# Patient Record
Sex: Male | Born: 1990 | Race: Black or African American | Hispanic: No | Marital: Single | Smoking: Never smoker
Health system: Southern US, Community
[De-identification: ages and names within clinical notes are randomized; demographics above are authoritative.]

## PROBLEM LIST (undated history)

## (undated) DIAGNOSIS — T7840XA Allergy, unspecified, initial encounter: Secondary | ICD-10-CM

## (undated) HISTORY — DX: Allergy, unspecified, initial encounter: T78.40XA

---

## 2015-06-05 ENCOUNTER — Ambulatory Visit (INDEPENDENT_AMBULATORY_CARE_PROVIDER_SITE_OTHER): Payer: BLUE CROSS/BLUE SHIELD | Admitting: Physician Assistant

## 2015-06-05 VITALS — BP 110/76 | HR 78 | Temp 98.3°F | Resp 16 | Ht 75.5 in | Wt 203.0 lb

## 2015-06-05 DIAGNOSIS — B354 Tinea corporis: Secondary | ICD-10-CM | POA: Diagnosis not present

## 2015-06-05 MED ORDER — KETOCONAZOLE 2 % EX CREA: 1.0000 "application " | TOPICAL_CREAM | Freq: Every day | CUTANEOUS | Status: AC

## 2015-06-05 NOTE — Progress Notes (Signed)
[facility] [address] [place] [address] [phone]  Date:  06/05/2015   Name:  Luis Ballard   DOB:  04-24-1991   MRN:  [id number]  PCP:  No PCP Per Patient    Chief Complaint: rash   History of Present Illness:  This is a 24 y.o. male who is presenting with a rash on the back on his neck. His mother noticed the rash today. He states he noticed a dry spot on the back on his neck 1 week ago but didn't think much of it. The area is not pruritic or painful. No lesions anywhere else on his body. His scalp is red but he states that is because he uses a stiff brush and brushes his head very hard. He states he has always had a flaky scalp and face. Generally doesn't consider himself to have sensitive skin. Did not have eczema as a child. He denies any PMH.   Review of Systems:  Review of Systems See HPI  There are no active problems to display for this patient.   Prior to Admission medications   Not on File    No Known Allergies  History reviewed. No pertinent past surgical history.  History  Substance Use Topics  . Smoking status: Never Smoker   . Smokeless tobacco: Not on file  . Alcohol Use: No    History reviewed. No pertinent family history.  Medication list has been reviewed and updated.  Physical Examination:  Physical Exam  Constitutional: He is oriented to person, place, and time. He appears well-developed and well-nourished. No distress.  HENT:  Head: Normocephalic and atraumatic.  Right Ear: Hearing normal.  Left Ear: Hearing normal.  Nose: Nose normal.  Eyes: Conjunctivae and lids are normal. Right eye exhibits no discharge. Left eye exhibits no discharge. No scleral icterus.  Pulmonary/Chest: Effort normal. No respiratory distress.  Musculoskeletal: Normal range of motion.  Neurological: He is alert and oriented to person, place, and time.  Skin: Skin is warm, dry and intact.  Left posterior neck with erythematous and  flaking annular lesion with central clearing. Scalp generally erythematous with some mild flaking but no distinct lesion. Mild flaking in bilateral eyebrows.   Psychiatric: He has a normal mood and affect. His speech is normal and behavior is normal. Thought content normal.   BP 110/76 mmHg  Pulse 78  Temp(Src) 98.3 F (36.8 C) (Oral)  Resp 16  Ht 6' 3.5" (1.918 m)  Wt 203 lb (92.08 kg)  BMI 25.03 kg/m2  SpO2 98%  Assessment and Plan:  1. Tinea corporis Lesion on back of neck characteristic of tinea corporis. He will apply ketoconazole QD x 2 weeks. Counseled on washing clothing, sheets, towels. Stay dry. Return if not getting better in 2 weeks. Scalp erythema and flaking likely d/t seborrhea. Discussed skin/scalp care. - ketoconazole (NIZORAL) 2 % cream; Apply 1 application topically daily.  Dispense: 15 g; Refill: 0   Alfonso Carden V. Dyke Brackett, MHS [facility]  06/07/2015

## 2015-06-05 NOTE — Patient Instructions (Signed)
Apply cream once a day for two weeks. Try to stay as dry as possible. Clean your towels and sheets. Return if not getting better after 2 weeks.  Body Ringworm Ringworm (tinea corporis) is a fungal infection of the skin on the body. This infection is not caused by worms, but is actually caused by a fungus. Fungus normally lives on the top of your skin and can be useful. However, in the case of ringworms, the fungus grows out of control and causes a skin infection. It can involve any area of skin on the body and can spread easily from one person to another (contagious). Ringworm is a common problem for children, but it can affect adults as well. Ringworm is also often found in athletes, especially wrestlers who share equipment and mats.  CAUSES  Ringworm of the body is caused by a fungus called dermatophyte. It can spread by:  Touchingother people who are infected.  Touchinginfected pets.  Touching or sharingobjects that have been in contact with the infected person or pet (hats, combs, towels, clothing, sports equipment). SYMPTOMS   Itchy, raised red spots and bumps on the skin.  Ring-shaped rash.  Redness near the border of the rash with a clear center.  Dry and scaly skin on or around the rash. Not every person develops a ring-shaped rash. Some develop only the red, scaly patches. DIAGNOSIS  Most often, ringworm can be diagnosed by performing a skin exam. Your caregiver may choose to take a skin scraping from the affected area. The sample will be examined under the microscope to see if the fungus is present.  TREATMENT  Body ringworm may be treated with a topical antifungal cream or ointment. Sometimes, an antifungal shampoo that can be used on your body is prescribed. You may be prescribed antifungal medicines to take by mouth if your ringworm is severe, keeps coming back, or lasts a long time.  HOME CARE INSTRUCTIONS   Only take over-the-counter or prescription medicines as  directed by your caregiver.  Wash the infected area and dry it completely before applying yourcream or ointment.  When using antifungal shampoo to treat the ringworm, leave the shampoo on the body for 3-5 minutes before rinsing.   Wear loose clothing to stop clothes from rubbing and irritating the rash.  Wash or change your bed sheets every night while you have the rash.  Have your pet treated by your veterinarian if it has the same infection. To prevent ringworm:   Practice good hygiene.  Wear sandals or shoes in public places and showers.  Do not share personal items with others.  Avoid touching red patches of skin on other people.  Avoid touching pets that have bald spots or wash your hands after doing so. SEEK MEDICAL CARE IF:   Your rash continues to spread after 7 days of treatment.  Your rash is not gone in 4 weeks.  The area around your rash becomes red, warm, tender, and swollen. Document Released: 10/11/2000 Document Revised: 07/08/2012 Document Reviewed: 04/27/2012 [facility] Patient Information 2015 [place], [place]. This information is not intended to replace advice given to you by your health care provider. Make sure you discuss any questions you have with your health care provider.

## 2015-09-15 ENCOUNTER — Ambulatory Visit (INDEPENDENT_AMBULATORY_CARE_PROVIDER_SITE_OTHER): Payer: BLUE CROSS/BLUE SHIELD

## 2015-09-16 ENCOUNTER — Ambulatory Visit (INDEPENDENT_AMBULATORY_CARE_PROVIDER_SITE_OTHER): Payer: BLUE CROSS/BLUE SHIELD | Admitting: Family Medicine

## 2015-09-16 VITALS — BP 116/82 | HR 86 | Temp 99.0°F | Resp 16 | Ht 75.5 in | Wt 206.2 lb

## 2015-09-16 DIAGNOSIS — R05 Cough: Secondary | ICD-10-CM

## 2015-09-16 DIAGNOSIS — J069 Acute upper respiratory infection, unspecified: Secondary | ICD-10-CM

## 2015-09-16 DIAGNOSIS — J029 Acute pharyngitis, unspecified: Secondary | ICD-10-CM | POA: Diagnosis not present

## 2015-09-16 DIAGNOSIS — R059 Cough, unspecified: Secondary | ICD-10-CM

## 2015-09-16 LAB — POCT RAPID STREP A (OFFICE): RAPID STREP A SCREEN: NEGATIVE

## 2015-09-16 MED ORDER — AMOXICILLIN-POT CLAVULANATE 875-125 MG PO TABS
1.0000 | ORAL_TABLET | Freq: Two times a day (BID) | ORAL | Status: DC
Start: 2015-09-16 — End: 2016-02-06

## 2015-09-16 MED ORDER — HYDROCODONE-HOMATROPINE 5-1.5 MG/5ML PO SYRP: ORAL_SOLUTION | ORAL | Status: DC

## 2015-09-16 MED ORDER — BENZONATATE 100 MG PO CAPS: 100.0000 mg | ORAL_CAPSULE | Freq: Three times a day (TID) | ORAL | Status: DC | PRN

## 2015-09-16 NOTE — Progress Notes (Signed)
Subjective:    Patient ID: Luis Ballard, male    DOB: 04-03-1991, 24 y.o.   MRN: [id number]  HPI Luis Ballard is a 24 y.o. male  Started over 2 weeks ago with sinus congestion, sore throat, then cough and congestion. Tried otc zyrtec, Nyquil, cold eeze cough drops.  Started to feel a little better in a few days, then feeling worse  - went to Urgent care 2 weeks ago. Treated with amoxicillin for "sinus infection", negative strep test. He only took amoxicillin for 6 days initially as was feeling better. Then 5 days ago - more sore throat, then body aches, stuffy nose. No sinus pressure or pain. Dry cough - feels like more productive past 2 days - green color. More sore throat, and cough throughout the night. Slight stuffy nose, but cough and sore throat primary issues. Took leftover amoxicillin past 4 days.   No fever, no wheezing or shortness of breath. Occasional sweats at night, but no fever. Ricola cough drops, no other otc meds past few days as taking abx's.   Uses qtips. Has used debrox in past with good relief.   There are no active problems to display for this patient.  Past Medical History  Diagnosis Date  . Allergy    History reviewed. No pertinent past surgical history. Allergies  Allergen Reactions  . Sulfa Antibiotics Other (See Comments)    As a child...does not know reaction   Prior to Admission medications   Not on File   Social History   Social History  . Marital Status: Single    Spouse Name: N/A  . Number of Children: N/A  . Years of Education: N/A   Occupational History  . Not on file.   Social History Main Topics  . Smoking status: Never Smoker   . Smokeless tobacco: Never Used  . Alcohol Use: 0.0 oz/week    0 Standard drinks or equivalent per week  . Drug Use: No  . Sexual Activity: Not on file   Other Topics Concern  . Not on file   Social History Narrative       Review of Systems  Constitutional: Positive for diaphoresis. Negative  for fever and chills.  HENT: Positive for congestion, rhinorrhea and sore throat. Negative for ear discharge, ear pain, facial swelling, hearing loss, sinus pressure and trouble swallowing.   Respiratory: Positive for cough. Negative for chest tightness and shortness of breath.        Objective:   Physical Exam  Constitutional: He is oriented to person, place, and time. He appears well-developed and well-nourished.  HENT:  Head: Normocephalic and atraumatic.  Right Ear: Tympanic membrane, external ear and ear canal normal.  Left Ear: Tympanic membrane, external ear and ear canal normal.  Nose: No rhinorrhea.  Mouth/Throat: Mucous membranes are normal. Posterior oropharyngeal erythema (minimal) present. No oropharyngeal exudate or tonsillar abscesses.  Nonobstructive cerumen in bilateral canals. Able to hear out of both ears.  Eyes: Conjunctivae are normal. Pupils are equal, round, and reactive to light.  Neck: Neck supple.  Cardiovascular: Normal rate, regular rhythm, normal heart sounds and intact distal pulses.   No murmur heard. Pulmonary/Chest: Effort normal and breath sounds normal. He has no wheezes. He has no rhonchi. He has no rales.  Abdominal: Soft. There is no tenderness.  Lymphadenopathy:    He has no cervical adenopathy.  Neurological: He is alert and oriented to person, place, and time.  Skin: Skin is warm and dry. No rash noted.  Psychiatric: He has a normal mood and affect. His behavior is normal.  Vitals reviewed.  Filed Vitals:   09/16/15 1144  BP: 116/82  Pulse: 86  Temp: 99 F (37.2 C)  TempSrc: Oral  Resp: 16  Height: 6' 3.5" (1.918 m)  Weight: 206 lb 4 oz (93.554 kg)  SpO2: 98%   Results for orders placed or performed in visit on 09/16/15  POCT rapid strep A  Result Value Ref Range   Rapid Strep A Screen Negative Negative      Assessment & Plan:   Luis Ballard is a 24 y.o. male Sore throat - Plan: POCT rapid strep A, amoxicillin-clavulanate  (AUGMENTIN) 875-125 MG tablet  Acute upper respiratory infection  Cough - Plan: benzonatate (TESSALON) 100 MG capsule, HYDROcodone-homatropine (HYCODAN) 5-1.5 MG/5ML syrup    suspected viral infection, and based on his initial description of symptoms 2 weeks ago, possible viral infection at that time as well. This may be persistent symptoms from the initial infection or a secondary viral illness. Less likely strep throat with no lymphadenopathy, fever, or exudate on tonsils, but  complicated by his partial use of antibiotics prior to testing today.  Also no sinus tenderness or symptoms of a sinus infection at this time.  -mucinex or Tessalon Perles as needed for cough, Hycodan cough syrup as needed at nighttime. Cepacol or other cough drops and sore throat care was discussed.  - If he is not improving the next few days, can start Augmentin, but discussed risks of subsequent use of multiple antibiotics. Correct antibiotic use discussed in the future.   -RTC/ER precautions given.   Advised against qtip use in ears. No impaction at present - can try debrox otc, and rtc for lavage if needed for impaction sx's.    Meds ordered this encounter  Medications  . benzonatate (TESSALON) 100 MG capsule    Sig: Take 1 capsule (100 mg total) by mouth 3 (three) times daily as needed for cough.    Dispense:  20 capsule    Refill:  0  . HYDROcodone-homatropine (HYCODAN) 5-1.5 MG/5ML syrup    Sig: 19m by mouth a bedtime as needed for cough.    Dispense:  120 mL    Refill:  0  . amoxicillin-clavulanate (AUGMENTIN) 875-125 MG tablet    Sig: Take 1 tablet by mouth 2 (two) times daily.    Dispense:  20 tablet    Refill:  0   Patient Instructions   Your exam today still appears to be consistent with a virus or viral infection.   Try over-the-counter Cepacol cough drops with a sore throat, Chloraseptic spray is okay to use as well.  Tylenol or Motrin as needed for body aches and sore throat. For your cough, try  over-the-counter Mucinex, or the Tessalon Perles that I prescribed during the day up to 3 times per day. If the cough is keeping you up at night, I did also write for some hydrocodone cough syrup. This can make you very sleepy so be careful operating machinery or driving  In the morning if you have used this medication the night before.   your strep test was negative, but this may be complicated given your recent antibiotic use.  Usually strep throat has other symptoms than what I see here today, but if you are not improving in the next few days,   I did write for Augmentin, which is a stronger antibiotic in which you report prescribed previously. However as we discussed, there  are side effects none only with this antibiotic but risks of other bacterial infections or reactions with multiple antibiotic uses. If you are improving, do not take this antibiotic and  Throw away the prescription  Return to the clinic or go to the nearest emergency room if any of your symptoms worsen or new symptoms occur.   Sore Throat A sore throat is pain, burning, irritation, or scratchiness of the throat. There is often pain or tenderness when swallowing or talking. A sore throat may be accompanied by other symptoms, such as coughing, sneezing, fever, and swollen neck glands. A sore throat is often the first sign of another sickness, such as a cold, flu, strep throat, or mononucleosis (commonly known as mono). Most sore throats go away without medical treatment. CAUSES  The most common causes of a sore throat include:  A viral infection, such as a cold, flu, or mono.  A bacterial infection, such as strep throat, tonsillitis, or whooping cough.  Seasonal allergies.  Dryness in the air.  Irritants, such as smoke or pollution.  Gastroesophageal reflux disease (GERD). HOME CARE INSTRUCTIONS   Only take over-the-counter medicines as directed by your caregiver.  Drink enough fluids to keep your urine clear or pale  yellow.  Rest as needed.  Try using throat sprays, lozenges, or sucking on hard candy to ease any pain (if older than 4 years or as directed).  Sip warm liquids, such as broth, herbal tea, or warm water with honey to relieve pain temporarily. You may also eat or drink cold or frozen liquids such as frozen ice pops.  Gargle with salt water (mix 1 tsp salt with 8 oz of water).  Do not smoke and avoid secondhand smoke.  Put a cool-mist humidifier in your bedroom at night to moisten the air. You can also turn on a hot shower and sit in the bathroom with the door closed for 5-10 minutes. SEEK IMMEDIATE MEDICAL CARE IF:  You have difficulty breathing.  You are unable to swallow fluids, soft foods, or your saliva.  You have increased swelling in the throat.  Your sore throat does not get better in 7 days.  You have nausea and vomiting.  You have a fever or persistent symptoms for more than 2-3 days.  You have a fever and your symptoms suddenly get worse. MAKE SURE YOU:   Understand these instructions.  Will watch your condition.  Will get help right away if you are not doing well or get worse.   This information is not intended to replace advice given to you by your health care provider. Make sure you discuss any questions you have with your health care provider.   Document Released: 11/21/2004 Document Revised: 11/04/2014 Document Reviewed: 06/21/2012 Elsevier Interactive Patient Education 2016 [employer] Inc[employer]  Upper Respiratory Infection, Adult Most upper respiratory infections (URIs) are a viral infection of the air passages leading to the lungs. A URI affects the nose, throat, and upper air passages. The most common type of URI is nasopharyngitis and is typically referred to as "the common cold." URIs run their course and usually go away on their own. Most of the time, a URI does not require medical attention, but sometimes a bacterial infection in the upper airways can follow  a viral infection. This is called a secondary infection. Sinus and middle ear infections are common types of secondary upper respiratory infections. Bacterial pneumonia can also complicate a URI. A URI can worsen asthma and chronic obstructive pulmonary disease (  COPD). Sometimes, these complications can require emergency medical care and may be life threatening.  CAUSES Almost all URIs are caused by viruses. A virus is a type of germ and can spread from one person to another.  RISKS FACTORS You may be at risk for a URI if:   You smoke.   You have chronic heart or lung disease.  You have a weakened defense (immune) system.   You are very young or very old.   You have nasal allergies or asthma.  You work in crowded or poorly ventilated areas.  You work in health care facilities or schools. SIGNS AND SYMPTOMS  Symptoms typically develop 2-3 days after you come in contact with a cold virus. Most viral URIs last 7-10 days. However, viral URIs from the influenza virus (flu virus) can last 14-18 days and are typically more severe. Symptoms may include:   Runny or stuffy (congested) nose.   Sneezing.   Cough.   Sore throat.   Headache.   Fatigue.   Fever.   Loss of appetite.   Pain in your forehead, behind your eyes, and over your cheekbones (sinus pain).  Muscle aches.  DIAGNOSIS  Your health care provider may diagnose a URI by:  Physical exam.  Tests to check that your symptoms are not due to another condition such as:  Strep throat.  Sinusitis.  Pneumonia.  Asthma. TREATMENT  A URI goes away on its own with time. It cannot be cured with medicines, but medicines may be prescribed or recommended to relieve symptoms. Medicines may help:  Reduce your fever.  Reduce your cough.  Relieve nasal congestion. HOME CARE INSTRUCTIONS   Take medicines only as directed by your health care provider.   Gargle warm saltwater or take cough drops to comfort  your throat as directed by your health care provider.  Use a warm mist humidifier or inhale steam from a shower to increase air moisture. This may make it easier to breathe.  Drink enough fluid to keep your urine clear or pale yellow.   Eat soups and other clear broths and maintain good nutrition.   Rest as needed.   Return to work when your temperature has returned to normal or as your health care provider advises. You may need to stay home longer to avoid infecting others. You can also use a face mask and careful hand washing to prevent spread of the virus.  Increase the usage of your inhaler if you have asthma.   Do not use any tobacco products, including cigarettes, chewing tobacco, or electronic cigarettes. If you need help quitting, ask your health care provider. PREVENTION  The best way to protect yourself from getting a cold is to practice good hygiene.   Avoid oral or hand contact with people with cold symptoms.   Wash your hands often if contact occurs.  There is no clear evidence that vitamin C, vitamin E, echinacea, or exercise reduces the chance of developing a cold. However, it is always recommended to get plenty of rest, exercise, and practice good nutrition.  SEEK MEDICAL CARE IF:   You are getting worse rather than better.   Your symptoms are not controlled by medicine.   You have chills.  You have worsening shortness of breath.  You have brown or red mucus.  You have yellow or brown nasal discharge.  You have pain in your face, especially when you bend forward.  You have a fever.  You have swollen neck glands.  You have pain while swallowing.  You have white areas in the back of your throat. SEEK IMMEDIATE MEDICAL CARE IF:   You have severe or persistent:  Headache.  Ear pain.  Sinus pain.  Chest pain.  You have chronic lung disease and any of the following:  Wheezing.  Prolonged cough.  Coughing up blood.  A change in your  usual mucus.  You have a stiff neck.  You have changes in your:  Vision.  Hearing.  Thinking.  Mood. MAKE SURE YOU:   Understand these instructions.  Will watch your condition.  Will get help right away if you are not doing well or get worse.   This information is not intended to replace advice given to you by your health care provider. Make sure you discuss any questions you have with your health care provider.   Document Released: 04/09/2001 Document Revised: 02/28/2015 Document Reviewed: 01/19/2014 Elsevier Interactive Patient Education 2016 Elsevier Inc.  Cough, Adult Coughing is a reflex that clears your throat and your airways. Coughing helps to heal and protect your lungs. It is normal to cough occasionally, but a cough that happens with other symptoms or lasts a long time may be a sign of a condition that needs treatment. A cough may last only 2-3 weeks (acute), or it may last longer than 8 weeks (chronic). CAUSES Coughing is commonly caused by:  Breathing in substances that irritate your lungs.  A viral or bacterial respiratory infection.  Allergies.  Asthma.  Postnasal drip.  Smoking.  Acid backing up from the stomach into the esophagus (gastroesophageal reflux).  Certain medicines.  Chronic lung problems, including COPD (or rarely, lung cancer).  Other medical conditions such as heart failure. HOME CARE INSTRUCTIONS  Pay attention to any changes in your symptoms. Take these actions to help with your discomfort:  Take medicines only as told by your health care provider.  If you were prescribed an antibiotic medicine, take it as told by your health care provider. Do not stop taking the antibiotic even if you start to feel better.  Talk with your health care provider before you take a cough suppressant medicine.  Drink enough fluid to keep your urine clear or pale yellow.  If the air is dry, use a cold steam vaporizer or humidifier in your bedroom  or your home to help loosen secretions.  Avoid anything that causes you to cough at work or at home.  If your cough is worse at night, try sleeping in a semi-upright position.  Avoid cigarette smoke. If you smoke, quit smoking. If you need help quitting, ask your health care provider.  Avoid caffeine.  Avoid alcohol.  Rest as needed. SEEK MEDICAL CARE IF:   You have new symptoms.  You cough up pus.  Your cough does not get better after 2-3 weeks, or your cough gets worse.  You cannot control your cough with suppressant medicines and you are losing sleep.  You develop pain that is getting worse or pain that is not controlled with pain medicines.  You have a fever.  You have unexplained weight loss.  You have night sweats. SEEK IMMEDIATE MEDICAL CARE IF:  You cough up blood.  You have difficulty breathing.  Your heartbeat is very fast.   This information is not intended to replace advice given to you by your health care provider. Make sure you discuss any questions you have with your health care provider.   Document Released: 04/12/2011 Document Revised: 07/05/2015 Document  Reviewed: 12/21/2014 Elsevier Interactive Patient Education [employer]

## 2015-09-16 NOTE — Patient Instructions (Addendum)
Your exam today still appears to be consistent with a virus or viral infection.   Try over-the-counter Cepacol cough drops with a sore throat, Chloraseptic spray is okay to use as well.  Tylenol or Motrin as needed for body aches and sore throat. For your cough, try over-the-counter Mucinex, or the Tessalon Perles that I prescribed during the day up to 3 times per day. If the cough is keeping you up at night, I did also write for some hydrocodone cough syrup. This can make you very sleepy so be careful operating machinery or driving  In the morning if you have used this medication the night before.   your strep test was negative, but this may be complicated given your recent antibiotic use.  Usually strep throat has other symptoms than what I see here today, but if you are not improving in the next few days, I did write for Augmentin for you to fill. . However as we discussed, there are side effects not only with this antibiotic but risks of other bacterial infections or reactions with multiple antibiotic courses. If you are improving, do not take this antibiotic and throw away the prescription   Return to the clinic or go to the nearest emergency room if any of your symptoms worsen or new symptoms occur.   Sore Throat A sore throat is pain, burning, irritation, or scratchiness of the throat. There is often pain or tenderness when swallowing or talking. A sore throat may be accompanied by other symptoms, such as coughing, sneezing, fever, and swollen neck glands. A sore throat is often the first sign of another sickness, such as a cold, flu, strep throat, or mononucleosis (commonly known as mono). Most sore throats go away without medical treatment. CAUSES  The most common causes of a sore throat include:  A viral infection, such as a cold, flu, or mono.  A bacterial infection, such as strep throat, tonsillitis, or whooping cough.  Seasonal allergies.  Dryness in the air.  Irritants, such as  smoke or pollution.  Gastroesophageal reflux disease (GERD). HOME CARE INSTRUCTIONS   Only take over-the-counter medicines as directed by your caregiver.  Drink enough fluids to keep your urine clear or pale yellow.  Rest as needed.  Try using throat sprays, lozenges, or sucking on hard candy to ease any pain (if older than 4 years or as directed).  Sip warm liquids, such as broth, herbal tea, or warm water with honey to relieve pain temporarily. You may also eat or drink cold or frozen liquids such as frozen ice pops.  Gargle with salt water (mix 1 tsp salt with 8 oz of water).  Do not smoke and avoid secondhand smoke.  Put a cool-mist humidifier in your bedroom at night to moisten the air. You can also turn on a hot shower and sit in the bathroom with the door closed for 5-10 minutes. SEEK IMMEDIATE MEDICAL CARE IF:  You have difficulty breathing.  You are unable to swallow fluids, soft foods, or your saliva.  You have increased swelling in the throat.  Your sore throat does not get better in 7 days.  You have nausea and vomiting.  You have a fever or persistent symptoms for more than 2-3 days.  You have a fever and your symptoms suddenly get worse. MAKE SURE YOU:   Understand these instructions.  Will watch your condition.  Will get help right away if you are not doing well or get worse.   This information  is not intended to replace advice given to you by your health care provider. Make sure you discuss any questions you have with your health care provider.   Document Released: 11/21/2004 Document Revised: 11/04/2014 Document Reviewed: 06/21/2012 Elsevier Interactive Patient Education 2016 Elsevier Inc.  Upper Respiratory Infection, Adult Most upper respiratory infections (URIs) are a viral infection of the air passages leading to the lungs. A URI affects the nose, throat, and upper air passages. The most common type of URI is nasopharyngitis and is typically  referred to as "the common cold." URIs run their course and usually go away on their own. Most of the time, a URI does not require medical attention, but sometimes a bacterial infection in the upper airways can follow a viral infection. This is called a secondary infection. Sinus and middle ear infections are common types of secondary upper respiratory infections. Bacterial pneumonia can also complicate a URI. A URI can worsen asthma and chronic obstructive pulmonary disease (COPD). Sometimes, these complications can require emergency medical care and may be life threatening.  CAUSES Almost all URIs are caused by viruses. A virus is a type of germ and can spread from one person to another.  RISKS FACTORS You may be at risk for a URI if:   You smoke.   You have chronic heart or lung disease.  You have a weakened defense (immune) system.   You are very young or very old.   You have nasal allergies or asthma.  You work in crowded or poorly ventilated areas.  You work in health care facilities or schools. SIGNS AND SYMPTOMS  Symptoms typically develop 2-3 days after you come in contact with a cold virus. Most viral URIs last 7-10 days. However, viral URIs from the influenza virus (flu virus) can last 14-18 days and are typically more severe. Symptoms may include:   Runny or stuffy (congested) nose.   Sneezing.   Cough.   Sore throat.   Headache.   Fatigue.   Fever.   Loss of appetite.   Pain in your forehead, behind your eyes, and over your cheekbones (sinus pain).  Muscle aches.  DIAGNOSIS  Your health care provider may diagnose a URI by:  Physical exam.  Tests to check that your symptoms are not due to another condition such as:  Strep throat.  Sinusitis.  Pneumonia.  Asthma. TREATMENT  A URI goes away on its own with time. It cannot be cured with medicines, but medicines may be prescribed or recommended to relieve symptoms. Medicines may  help:  Reduce your fever.  Reduce your cough.  Relieve nasal congestion. HOME CARE INSTRUCTIONS   Take medicines only as directed by your health care provider.   Gargle warm saltwater or take cough drops to comfort your throat as directed by your health care provider.  Use a warm mist humidifier or inhale steam from a shower to increase air moisture. This may make it easier to breathe.  Drink enough fluid to keep your urine clear or pale yellow.   Eat soups and other clear broths and maintain good nutrition.   Rest as needed.   Return to work when your temperature has returned to normal or as your health care provider advises. You may need to stay home longer to avoid infecting others. You can also use a face mask and careful hand washing to prevent spread of the virus.  Increase the usage of your inhaler if you have asthma.   Do not use any  tobacco products, including cigarettes, chewing tobacco, or electronic cigarettes. If you need help quitting, ask your health care provider. PREVENTION  The best way to protect yourself from getting a cold is to practice good hygiene.   Avoid oral or hand contact with people with cold symptoms.   Wash your hands often if contact occurs.  There is no clear evidence that vitamin C, vitamin E, echinacea, or exercise reduces the chance of developing a cold. However, it is always recommended to get plenty of rest, exercise, and practice good nutrition.  SEEK MEDICAL CARE IF:   You are getting worse rather than better.   Your symptoms are not controlled by medicine.   You have chills.  You have worsening shortness of breath.  You have brown or red mucus.  You have yellow or brown nasal discharge.  You have pain in your face, especially when you bend forward.  You have a fever.  You have swollen neck glands.  You have pain while swallowing.  You have white areas in the back of your throat. SEEK IMMEDIATE MEDICAL CARE IF:    You have severe or persistent:  Headache.  Ear pain.  Sinus pain.  Chest pain.  You have chronic lung disease and any of the following:  Wheezing.  Prolonged cough.  Coughing up blood.  A change in your usual mucus.  You have a stiff neck.  You have changes in your:  Vision.  Hearing.  Thinking.  Mood. MAKE SURE YOU:   Understand these instructions.  Will watch your condition.  Will get help right away if you are not doing well or get worse.   This information is not intended to replace advice given to you by your health care provider. Make sure you discuss any questions you have with your health care provider.   Document Released: 04/09/2001 Document Revised: 02/28/2015 Document Reviewed: 01/19/2014 Elsevier Interactive Patient Education 2016 Elsevier Inc.  Cough, Adult Coughing is a reflex that clears your throat and your airways. Coughing helps to heal and protect your lungs. It is normal to cough occasionally, but a cough that happens with other symptoms or lasts a long time may be a sign of a condition that needs treatment. A cough may last only 2-3 weeks (acute), or it may last longer than 8 weeks (chronic). CAUSES Coughing is commonly caused by:  Breathing in substances that irritate your lungs.  A viral or bacterial respiratory infection.  Allergies.  Asthma.  Postnasal drip.  Smoking.  Acid backing up from the stomach into the esophagus (gastroesophageal reflux).  Certain medicines.  Chronic lung problems, including COPD (or rarely, lung cancer).  Other medical conditions such as heart failure. HOME CARE INSTRUCTIONS  Pay attention to any changes in your symptoms. Take these actions to help with your discomfort:  Take medicines only as told by your health care provider.  If you were prescribed an antibiotic medicine, take it as told by your health care provider. Do not stop taking the antibiotic even if you start to feel  better.  Talk with your health care provider before you take a cough suppressant medicine.  Drink enough fluid to keep your urine clear or pale yellow.  If the air is dry, use a cold steam vaporizer or humidifier in your bedroom or your home to help loosen secretions.  Avoid anything that causes you to cough at work or at home.  If your cough is worse at night, try sleeping in a semi-upright position.  Avoid cigarette smoke. If you smoke, quit smoking. If you need help quitting, ask your health care provider.  Avoid caffeine.  Avoid alcohol.  Rest as needed. SEEK MEDICAL CARE IF:   You have new symptoms.  You cough up pus.  Your cough does not get better after 2-3 weeks, or your cough gets worse.  You cannot control your cough with suppressant medicines and you are losing sleep.  You develop pain that is getting worse or pain that is not controlled with pain medicines.  You have a fever.  You have unexplained weight loss.  You have night sweats. SEEK IMMEDIATE MEDICAL CARE IF:  You cough up blood.  You have difficulty breathing.  Your heartbeat is very fast.   This information is not intended to replace advice given to you by your health care provider. Make sure you discuss any questions you have with your health care provider.   Document Released: 04/12/2011 Document Revised: 07/05/2015 Document Reviewed: 12/21/2014 Elsevier Interactive Patient Education [employer]2016 Elsevier Inc.

## 2016-02-06 ENCOUNTER — Ambulatory Visit (INDEPENDENT_AMBULATORY_CARE_PROVIDER_SITE_OTHER): Payer: BLUE CROSS/BLUE SHIELD

## 2016-02-06 ENCOUNTER — Ambulatory Visit (INDEPENDENT_AMBULATORY_CARE_PROVIDER_SITE_OTHER): Payer: BLUE CROSS/BLUE SHIELD | Admitting: Urgent Care

## 2016-02-06 VITALS — BP 122/82 | HR 76 | Temp 98.1°F | Resp 16 | Ht 75.5 in

## 2016-02-06 DIAGNOSIS — S99912A Unspecified injury of left ankle, initial encounter: Secondary | ICD-10-CM | POA: Diagnosis not present

## 2016-02-06 DIAGNOSIS — H6123 Impacted cerumen, bilateral: Secondary | ICD-10-CM

## 2016-02-06 DIAGNOSIS — S93402A Sprain of unspecified ligament of left ankle, initial encounter: Secondary | ICD-10-CM

## 2016-02-06 NOTE — Patient Instructions (Addendum)
RICE for Routine Care of Injuries Theroutine careofmanyinjuriesincludes rest, ice, compression, and elevation (RICE therapy). RICE therapy is often recommended for injuries to soft tissues, such as a muscle strain, ligament injuries, bruises, and overuse injuries. It can also be used for some bony injuries. Using RICE therapy can help to relieve pain, lessen swelling, and enable your body to heal. Rest Rest is required to allow your body to heal. This usually involves reducing your normal activities and avoiding use of the injured part of your body. Generally, you can return to your normal activities when you are comfortable and have been given permission by your health care provider. Ice Icing your injury helps to keep the swelling down, and it lessens pain. Do not apply ice directly to your skin.  Put ice in a plastic bag.  Place a towel between your skin and the bag.  Leave the ice on for 20 minutes, 2-3 times a day. Do this for as long as you are directed by your health care provider. Compression Compression means putting pressure on the injured area. Compression helps to keep swelling down, gives support, and helps with discomfort. Compression may be done with an elastic bandage. If an elastic bandage has been applied, follow these general tips:  Remove and reapply the bandage every 3-4 hours or as directed by your health care provider.  Make sure the bandage is not wrapped too tightly, because this can cut off circulation. If part of your body beyond the bandage becomes blue, numb, cold, swollen, or more painful, your bandage is most likely too tight. If this occurs, remove your bandage and reapply it more loosely.  See your health care provider if the bandage seems to be making your problems worse rather than better. Elevation Elevation means keeping the injured area raised. This helps to lessen swelling and decrease pain. If possible, your injured area should be elevated at or  above the level of your heart or the center of your chest. WHEN SHOULD I SEEK MEDICAL CARE? You should seek medical care if:  Your pain and swelling continue.  Your symptoms are getting worse rather than improving. These symptoms may indicate that further evaluation or further X-rays are needed. Sometimes, X-rays may not show a small broken bone (fracture) until a number of days later. Make a follow-up appointment with your health care provider. WHEN SHOULD I SEEK IMMEDIATE MEDICAL CARE? You should seek immediate medical care if:  You have sudden severe pain at or below the area of your injury.  You have redness or increased swelling around your injury.  You have tingling or numbness at or below the area of your injury that does not improve after you remove the elastic bandage.   This information is not intended to replace advice given to you by your health care provider. Make sure you discuss any questions you have with your health care provider.   Document Released: 01/26/2001 Document Revised: 07/05/2015 Document Reviewed: 09/21/2014 Elsevier Interactive Patient Education 2016 Elsevier Inc.     IF you received an x-ray today, you will receive an invoice from [facility]. Please contact [facility] at [phone] with questions or concerns regarding your invoice.   IF you received labwork today, you will receive an invoice from [facility]. Please contact [facility] at [phone] with questions or concerns regarding your invoice.   Our billing staff will not be able to assist you with questions regarding bills from these companies.  You will be contacted with   the lab results as soon as they are available. The fastest way to get your results is to activate your My Chart account. Instructions are located on the last page of this paperwork. If you have not heard from us regarding the results in 2 weeks, please contact this office.      

## 2016-02-06 NOTE — Progress Notes (Signed)
    MRN: [id number] DOB: 06/23/1991  Subjective:   Luis Luis Ballard Luis Ballard is a 25 y.o. male presenting for chief complaint of Foot Pain and ear clogged  Ankle - Reports that he rolled his left ankle yesterday night while playing basketball. He felt immediate pain, had subsequent swelling, has significant difficulty ambulating. He has been propping up his leg and icing since yesterday.   Ears - Reports history of having cerumen impaction. Feels like this is the case now. Would like to have his ears checked.  Luis Luis Ballard currently has no medications in their medication list. Also is allergic to sulfa antibiotics.  Luis Luis Ballard  has a past medical history of Allergy. Also  has no past surgical history on file.  Objective:   Vitals: BP 122/82 mmHg  Pulse 76  Temp(Src) 98.1 F (36.7 C) (Oral)  Resp 16  Ht 6' 3.5" (1.918 m)  SpO2 99%  Physical Exam  Constitutional: He is oriented to person, place, and time. He appears well-developed and well-nourished.  HENT:  Ears cerumen occluded bilaterally.  Cardiovascular: Normal rate.   Pulmonary/Chest: Effort normal.  Musculoskeletal:       Left ankle: He exhibits decreased range of motion (inversion and eversion), swelling (over area depicted) and ecchymosis (trace over area depicted). He exhibits no deformity and no laceration. Tenderness. AITFL and head of 5th metatarsal tenderness found. No lateral malleolus, no medial malleolus, no CF ligament, no posterior TFL and no proximal fibula tenderness found. Achilles tendon exhibits no pain and no defect.       Feet:  Neurological: He is alert and oriented to person, place, and time.  Skin: Skin is warm and dry.   Dg Ankle Complete Left  02/06/2016  CLINICAL DATA:  Left ankle injury. Rolled left ankle yesterday playing basketball. EXAM: LEFT ANKLE COMPLETE - 3+ VIEW COMPARISON:  None. FINDINGS: There is no evidence of fracture, dislocation, or joint effusion. There is no evidence of arthropathy or other focal  bone abnormality. Soft tissues are unremarkable. IMPRESSION: Negative. Electronically Signed   By: Charlett NoseKevin  Dover M.D.   On: 02/06/2016 09:40   Assessment and Plan :   1. Left ankle injury, initial encounter 2. Left ankle sprain, initial encounter\ - Stable, advised RICE method. RTC if swelling and pain fails to improve after 1-2 weeks.  3. Cerumen impaction, bilateral - Ear lavage performed today. RTC prn.  Wallis BambergMario Jevonte Clanton, PA-C [facility] [phone] 02/06/2016 9:14 AM

## 2016-02-26 ENCOUNTER — Ambulatory Visit (INDEPENDENT_AMBULATORY_CARE_PROVIDER_SITE_OTHER): Payer: BLUE CROSS/BLUE SHIELD | Admitting: Emergency Medicine

## 2016-02-26 ENCOUNTER — Ambulatory Visit (INDEPENDENT_AMBULATORY_CARE_PROVIDER_SITE_OTHER): Payer: BLUE CROSS/BLUE SHIELD

## 2016-02-26 VITALS — BP 118/82 | HR 73 | Temp 98.9°F | Resp 16 | Ht 76.0 in | Wt 211.0 lb

## 2016-02-26 DIAGNOSIS — H5310 Unspecified subjective visual disturbances: Secondary | ICD-10-CM | POA: Diagnosis not present

## 2016-02-26 DIAGNOSIS — S0512XA Contusion of eyeball and orbital tissues, left eye, initial encounter: Secondary | ICD-10-CM

## 2016-02-26 NOTE — Patient Instructions (Addendum)
You have appt with Dr. Marchelle Gearinghris Groat today at 2:45 pm.   [facility] [address],  [place], [place] [address] Phone: [phone]                          IF you received an x-ray today, you will receive an invoice from [facility]. Please contact [facility] at [phone] with questions or concerns regarding your invoice.   IF you received labwork today, you will receive an invoice from [facility]. Please contact [facility] at [phone] with questions or concerns regarding your invoice.   Our billing staff will not be able to assist you with questions regarding bills from these companies.  You will be contacted with the lab results as soon as they are available. The fastest way to get your results is to activate your My Chart account. Instructions are located on the last page of this paperwork. If you have not heard from us regarding the results in 2 weeks, please contact this office.
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
## 2016-02-27 ENCOUNTER — Ambulatory Visit
Admission: RE | Admit: 2016-02-27 | Discharge: 2016-02-27 | Disposition: A | Discharge status: Home / Self Care | Payer: BLUE CROSS/BLUE SHIELD | Source: Ambulatory Visit | Attending: Emergency Medicine | Admitting: Emergency Medicine

## 2016-02-27 DIAGNOSIS — H5310 Unspecified subjective visual disturbances: Secondary | ICD-10-CM

## 2016-02-27 DIAGNOSIS — S0512XA Contusion of eyeball and orbital tissues, left eye, initial encounter: Secondary | ICD-10-CM

## 2016-02-28 ENCOUNTER — Telehealth: Payer: Self-pay
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
## 2016-03-02 ENCOUNTER — Other Ambulatory Visit: Payer: Self-pay | Admitting: Emergency Medicine

## 2016-03-02 DIAGNOSIS — S022XXA Fracture of nasal bones, initial encounter for closed fracture: Secondary | ICD-10-CM

## 2016-03-02 NOTE — Telephone Encounter (Signed)
Spoke with patient advised ENT referral was put in.  Patient understood

## 2016-03-02 NOTE — Telephone Encounter (Signed)
Typically we wait to see how the nasal appearance is after the swelling goes down I will go ahead and place a referral to ENT that way he can be followed by the eye specialist and ENT

## 2016-08-21 IMAGING — CR DG ANKLE COMPLETE 3+V*L*
4 series · 4 of 4 positions shown · non-contrast
Comparison: None.

CLINICAL DATA: Left ankle injury. Rolled left ankle yesterday
playing basketball.

EXAM:
LEFT ANKLE COMPLETE - 3+ VIEW

[AP]
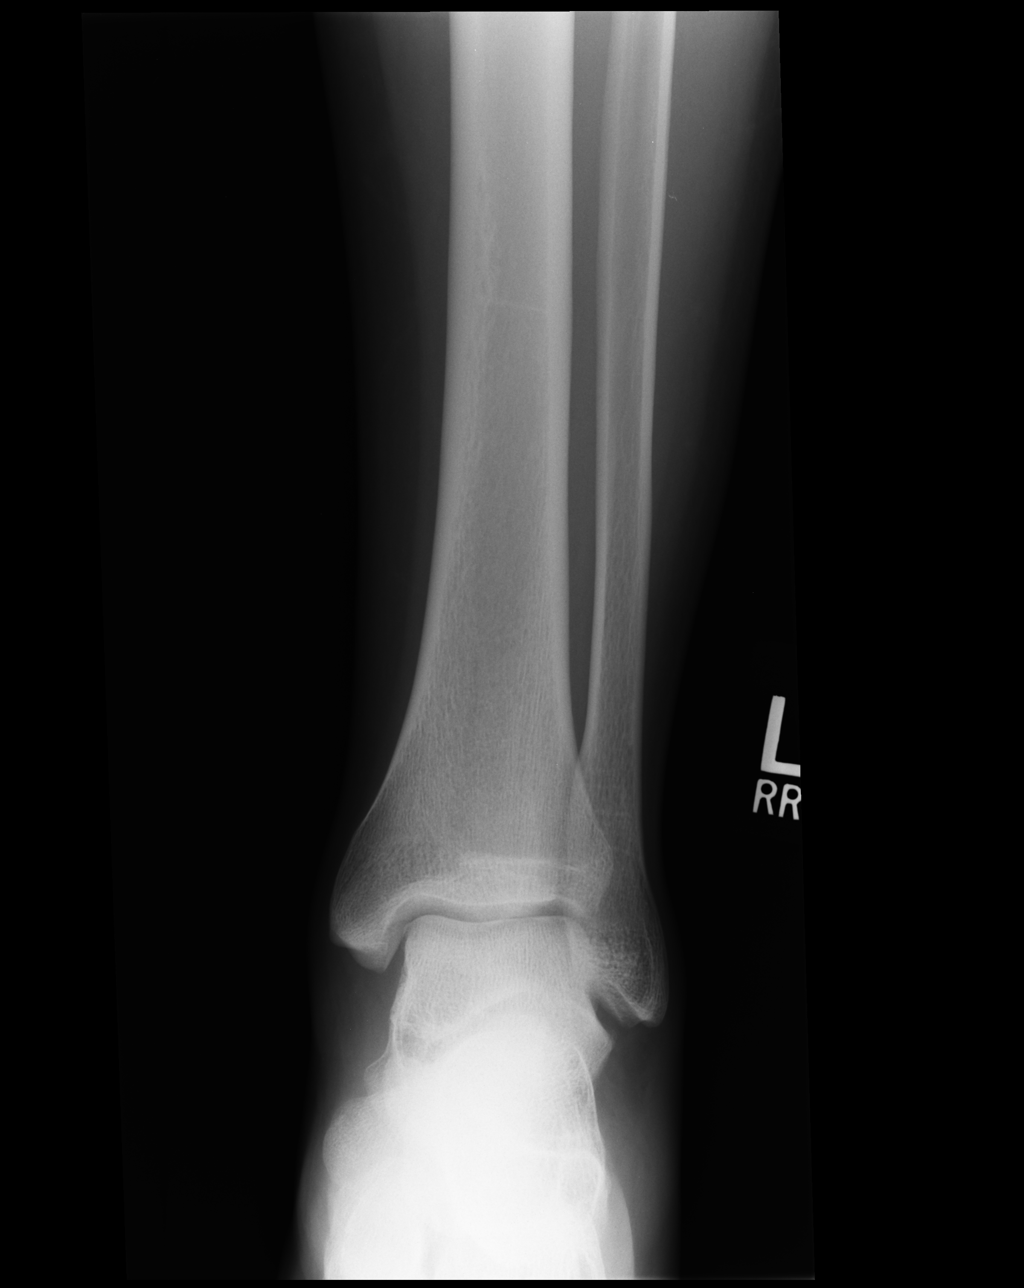

[ap obl int rot (1 of 2)]
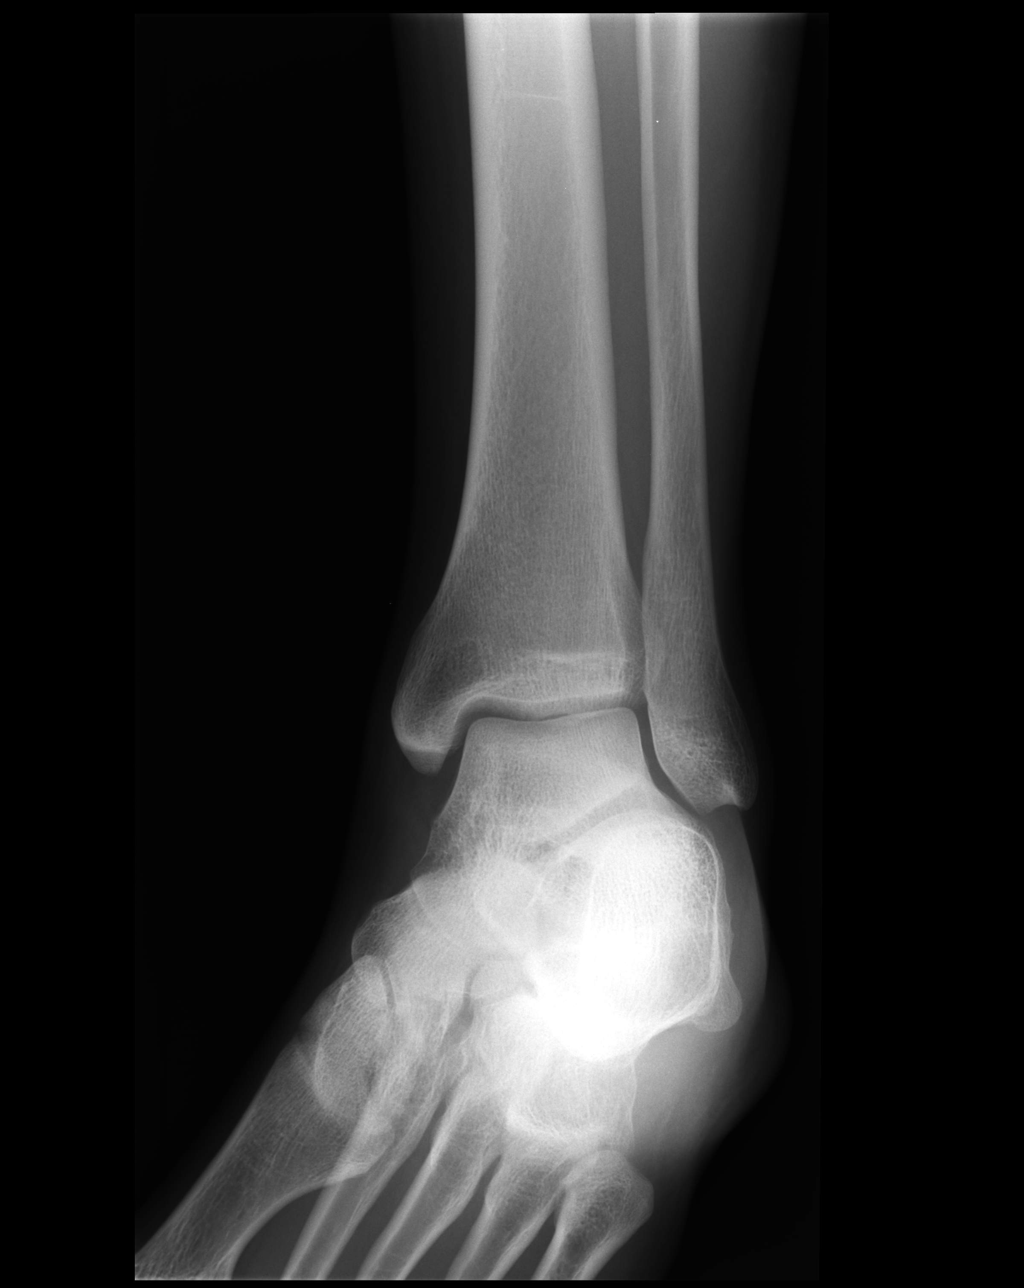

[ap obl int rot (2 of 2)]
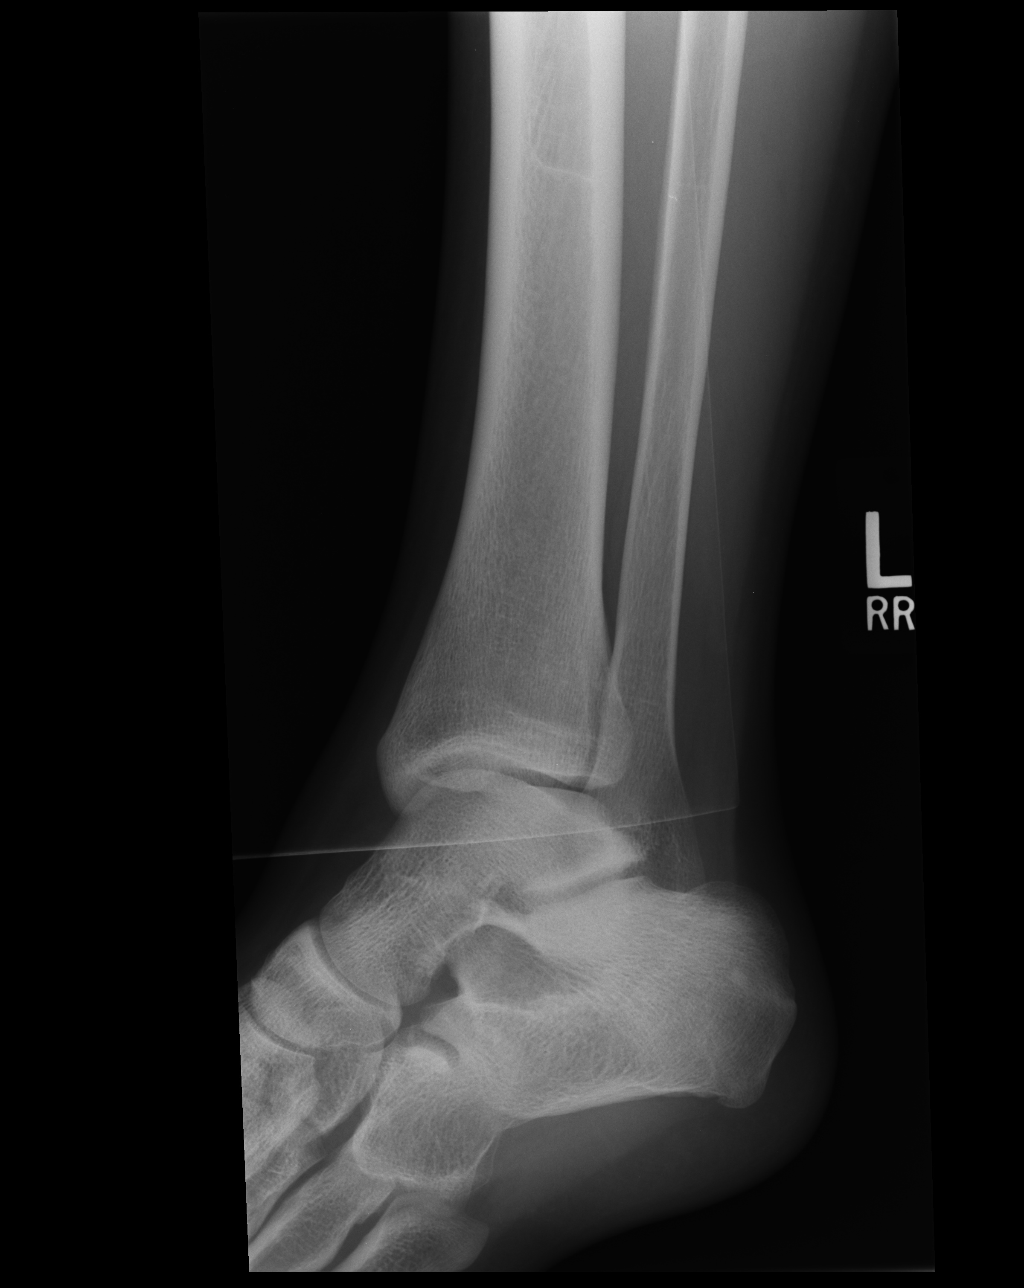

[lateral]
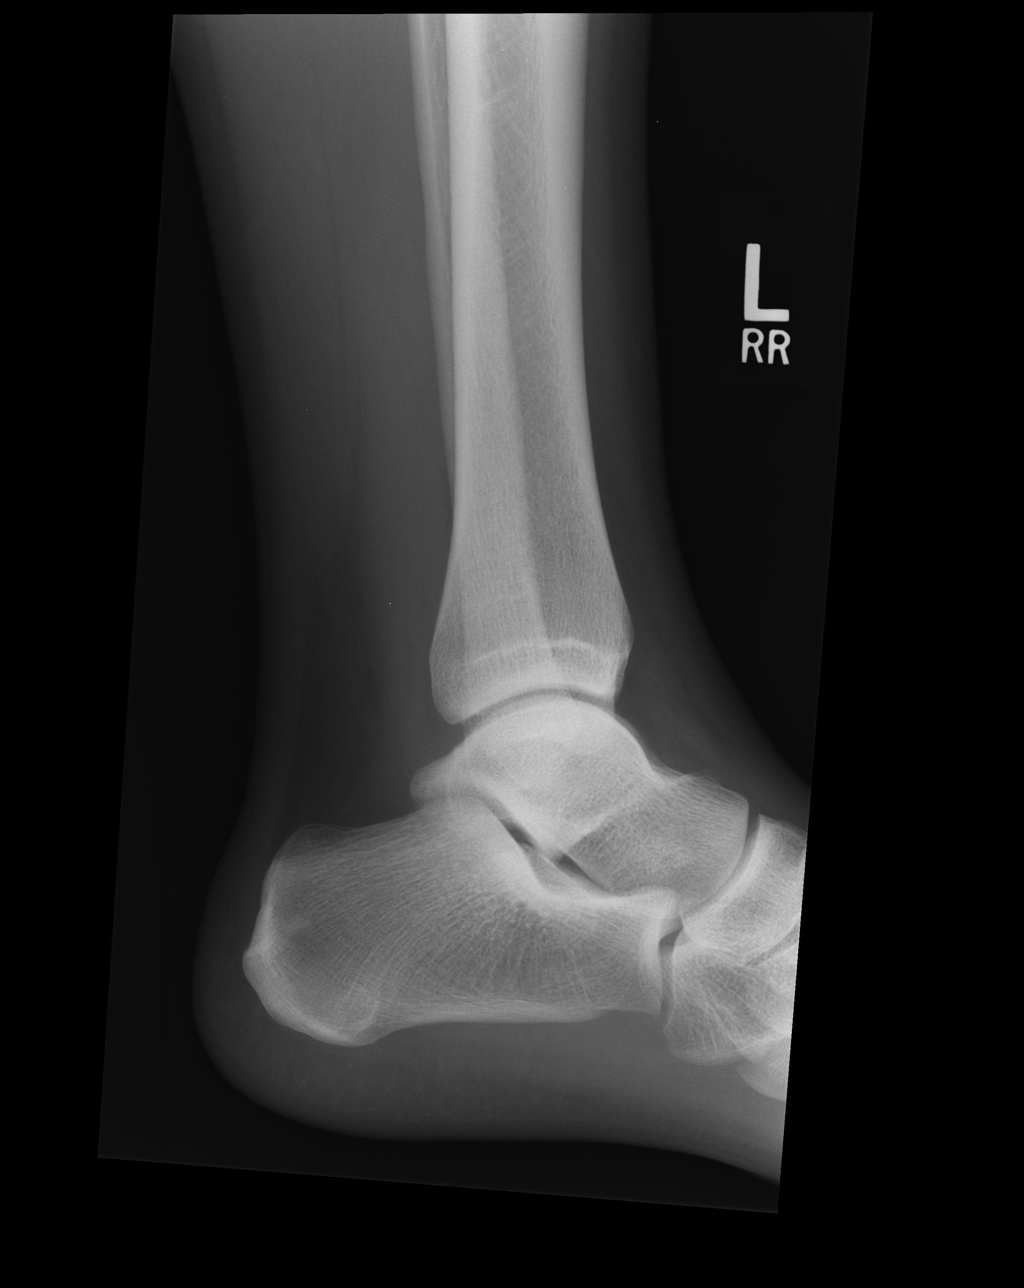

[4 of 4 positions shown; findings below may reference images not displayed]

FINDINGS: There is no evidence of fracture, dislocation, or joint effusion.
There is no evidence of arthropathy or other focal bone abnormality.
Soft tissues are unremarkable.
IMPRESSION: Negative.

## 2017-03-10 ENCOUNTER — Encounter: Payer: Self-pay | Admitting: Urgent Care

## 2017-03-10 ENCOUNTER — Ambulatory Visit (INDEPENDENT_AMBULATORY_CARE_PROVIDER_SITE_OTHER): Payer: BLUE CROSS/BLUE SHIELD | Admitting: Urgent Care

## 2017-03-10 VITALS — BP 140/63 | HR 91 | Temp 98.9°F | Resp 16 | Ht 76.0 in | Wt 214.0 lb

## 2017-03-10 DIAGNOSIS — R05 Cough: Secondary | ICD-10-CM

## 2017-03-10 DIAGNOSIS — J029 Acute pharyngitis, unspecified: Secondary | ICD-10-CM

## 2017-03-10 DIAGNOSIS — R059 Cough, unspecified: Secondary | ICD-10-CM

## 2017-03-10 DIAGNOSIS — J019 Acute sinusitis, unspecified: Secondary | ICD-10-CM | POA: Diagnosis not present

## 2017-03-10 MED ORDER — BENZONATATE 100 MG PO CAPS: 100.0000 mg | ORAL_CAPSULE | Freq: Three times a day (TID) | ORAL | 0 refills | Status: AC | PRN

## 2017-03-10 MED ORDER — AMOXICILLIN-POT CLAVULANATE 500-125 MG PO TABS: 1.0000 | ORAL_TABLET | Freq: Three times a day (TID) | ORAL | 0 refills | Status: AC

## 2017-03-10 NOTE — Progress Notes (Signed)
  MRN: [id number] DOB: 04/08/1991  Subjective:   Luis Ballard is a 26 y.o. male presenting for chief complaint of URI (cough for week in a  half, ST due to coughing, can sleep, and HA)  Reports 10 day history of worsening productive cough, headache with cough, sore throat, severe nasal congestion. Started taking Claritin daily, otc cough syrup. Denies sinus pain, ear pain, ear drainage, chest pain, shob, wheezing, n/v, abdominal pain, rashes. Denies smoking cigarettes.   Luis Ballard is not currently taking any medications. Also is allergic to sulfa antibiotics. Luis Ballard  has a past medical history of Allergy. Also denies past surgical history.  Objective:   Vitals: BP 140/63 (BP Location: Right Arm, Patient Position: Sitting, Cuff Size: Small)   Pulse 91   Temp 98.9 F (37.2 C) (Oral)   Resp 16   Ht 6\' 4"  (1.93 m)   Wt 214 lb (97.1 kg)   SpO2 99%   BMI 26.05 kg/m   Physical Exam  Constitutional: He is oriented to person, place, and time. He appears well-developed and well-nourished.  HENT:  TM's intact bilaterally, no effusions or erythema. Nasal turbinates erythematous with mild bilateral maxillary sinus tenderness, nasal passages patent. No sinus tenderness. Throat with moderate post-nasal drainage, mucous membranes moist.  Eyes: Right eye exhibits no discharge. Left eye exhibits no discharge.  Neck: Normal range of motion. Neck supple.  Cardiovascular: Normal rate, regular rhythm and intact distal pulses.  Exam reveals no gallop and no friction rub.   No murmur heard. Pulmonary/Chest: No respiratory distress. He has no wheezes. He has no rales.  Lymphadenopathy:    He has no cervical adenopathy.  Neurological: He is alert and oriented to person, place, and time.  Skin: Skin is warm and dry.  Psychiatric: He has a normal mood and affect.   Assessment and Plan :   1. Acute sinusitis, recurrence not specified, unspecified location 2. Sore throat 3. Cough - Will cover for  bacterial sinusitis with Augmentin, use supportive care otherwise. Return-to-clinic precautions discussed, patient verbalized understanding.   Luis BambergMario Eriko Economos, PA-C [facility] [phone] 03/10/2017  2:50 PM

## 2017-03-10 NOTE — Patient Instructions (Addendum)
 Sinusitis, Adult Sinusitis is soreness and inflammation of your sinuses. Sinuses are hollow spaces in the bones around your face. Your sinuses are located:  Around your eyes.  In the middle of your forehead.  Behind your nose.  In your cheekbones.  Your sinuses and nasal passages are lined with a stringy fluid (mucus). Mucus normally drains out of your sinuses. When your nasal tissues become inflamed or swollen, the mucus can become trapped or blocked so air cannot flow through your sinuses. This allows bacteria, viruses, and funguses to grow, which leads to infection. Sinusitis can develop quickly and last for 7?10 days (acute) or for more than 12 weeks (chronic). Sinusitis often develops after a cold. What are the causes? This condition is caused by anything that creates swelling in the sinuses or stops mucus from draining, including:  Allergies.  Asthma.  Bacterial or viral infection.  Abnormally shaped bones between the nasal passages.  Nasal growths that contain mucus (nasal polyps).  Narrow sinus openings.  Pollutants, such as chemicals or irritants in the air.  A foreign object stuck in the nose.  A fungal infection. This is rare.  What increases the risk? The following factors may make you more likely to develop this condition:  Having allergies or asthma.  Having had a recent cold or respiratory tract infection.  Having structural deformities or blockages in your nose or sinuses.  Having a weak immune system.  Doing a lot of swimming or diving.  Overusing nasal sprays.  Smoking.  What are the signs or symptoms? The main symptoms of this condition are pain and a feeling of pressure around the affected sinuses. Other symptoms include:  Upper toothache.  Earache.  Headache.  Bad breath.  Decreased sense of smell and taste.  A cough that may get worse at night.  Fatigue.  Fever.  Thick drainage from your nose. The drainage is often green  and it may contain pus (purulent).  Stuffy nose or congestion.  Postnasal drip. This is when extra mucus collects in the throat or back of the nose.  Swelling and warmth over the affected sinuses.  Sore throat.  Sensitivity to light.  How is this diagnosed? This condition is diagnosed based on symptoms, a medical history, and a physical exam. To find out if your condition is acute or chronic, your health care provider may:  Look in your nose for signs of nasal polyps.  Tap over the affected sinus to check for signs of infection.  View the inside of your sinuses using an imaging device that has a light attached (endoscope).  If your health care provider suspects that you have chronic sinusitis, you may also:  Be tested for allergies.  Have a sample of mucus taken from your nose (nasal culture) and checked for bacteria.  Have a mucus sample examined to see if your sinusitis is related to an allergy.  If your sinusitis does not respond to treatment and it lasts longer than 8 weeks, you may have an MRI or CT scan to check your sinuses. These scans also help to determine how severe your infection is. In rare cases, a bone biopsy may be done to rule out more serious types of fungal sinus disease. How is this treated? Treatment for sinusitis depends on the cause and whether your condition is chronic or acute. If a virus is causing your sinusitis, your symptoms will go away on their own within 10 days. You may be given medicines to relieve your   symptoms, including:  Topical nasal decongestants. They shrink swollen nasal passages and let mucus drain from your sinuses.  Antihistamines. These drugs block inflammation that is triggered by allergies. This can help to ease swelling in your nose and sinuses.  Topical nasal corticosteroids. These are nasal sprays that ease inflammation and swelling in your nose and sinuses.  Nasal saline washes. These rinses can help to get rid of thick mucus  in your nose.  If your condition is caused by bacteria, you will be given an antibiotic medicine. If your condition is caused by a fungus, you will be given an antifungal medicine. Surgery may be needed to correct underlying conditions, such as narrow nasal passages. Surgery may also be needed to remove polyps. Follow these instructions at home: Medicines  Take, use, or apply over-the-counter and prescription medicines only as told by your health care provider. These may include nasal sprays.  If you were prescribed an antibiotic medicine, take it as told by your health care provider. Do not stop taking the antibiotic even if you start to feel better. Hydrate and Humidify  Drink enough water to keep your urine clear or pale yellow. Staying hydrated will help to thin your mucus.  Use a cool mist humidifier to keep the humidity level in your home above 50%.  Inhale steam for 10-15 minutes, 3-4 times a day or as told by your health care provider. You can do this in the bathroom while a hot shower is running.  Limit your exposure to cool or dry air. Rest  Rest as much as possible.  Sleep with your head raised (elevated).  Make sure to get enough sleep each night. General instructions  Apply a warm, moist washcloth to your face 3-4 times a day or as told by your health care provider. This will help with discomfort.  Wash your hands often with soap and water to reduce your exposure to viruses and other germs. If soap and water are not available, use hand sanitizer.  Do not smoke. Avoid being around people who are smoking (secondhand smoke).  Keep all follow-up visits as told by your health care provider. This is important. Contact a health care provider if:  You have a fever.  Your symptoms get worse.  Your symptoms do not improve within 10 days. Get help right away if:  You have a severe headache.  You have persistent vomiting.  You have pain or swelling around your face or  eyes.  You have vision problems.  You develop confusion.  Your neck is stiff.  You have trouble breathing. This information is not intended to replace advice given to you by your health care provider. Make sure you discuss any questions you have with your health care provider. Document Released: 10/14/2005 Document Revised: 06/09/2016 Document Reviewed: 08/09/2015 Elsevier Interactive Patient Education  2017 Elsevier Inc.    IF you received an x-ray today, you will receive an invoice from [facility]. Please contact [facility] at [phone] with questions or concerns regarding your invoice.   IF you received labwork today, you will receive an invoice from [facility]. Please contact [facility] at [phone] with questions or concerns regarding your invoice.   Our billing staff will not be able to assist you with questions regarding bills from these companies.  You will be contacted with the lab results as soon as they are available. The fastest way to get your results is to activate your My Chart account. Instructions are located on the last page   of this paperwork. If you have not heard from us regarding the results in 2 weeks, please contact this office.      

## 2017-03-15 ENCOUNTER — Ambulatory Visit (INDEPENDENT_AMBULATORY_CARE_PROVIDER_SITE_OTHER): Payer: BLUE CROSS/BLUE SHIELD | Admitting: Family Medicine

## 2017-03-15 ENCOUNTER — Encounter: Payer: Self-pay | Admitting: Family Medicine

## 2017-03-15 VITALS — BP 102/62 | HR 74 | Temp 98.2°F | Resp 16 | Ht 74.0 in | Wt 219.0 lb

## 2017-03-15 DIAGNOSIS — G933 Postviral fatigue syndrome: Secondary | ICD-10-CM

## 2017-03-15 DIAGNOSIS — J029 Acute pharyngitis, unspecified: Secondary | ICD-10-CM | POA: Diagnosis not present

## 2017-03-15 DIAGNOSIS — G9331 Postviral fatigue syndrome: Secondary | ICD-10-CM

## 2017-03-15 MED ORDER — PREDNISONE 20 MG PO TABS: ORAL_TABLET | ORAL | 0 refills | Status: DC

## 2017-03-15 NOTE — Patient Instructions (Addendum)
  Drink plenty of fluids and get enough rest  Take the remaining Augmentin (amoxicillin/clavulanate)  Take the prednisone 3 pills daily for 2 days, then 2 daily for 2 days, then 1 daily for 2 days. Best taken in the morning after breakfast but take the first pills when you get them today. If you take them at bedtime sometimes messes up you sleep.  Also consider taking one of the nonsedating antihistamines such as Claritin (loratadine) or Allegra ( fexofenadine) or Zyrtec (cetirizine)  I expect this to clear you up fairly quickly, but if you get worse at any time come back.   IF you received an x-ray today, you will receive an invoice from [facility]. Please contact [facility] at [phone] with questions or concerns regarding your invoice.   IF you received labwork today, you will receive an invoice from [facility]. Please contact [facility] at [phone] with questions or concerns regarding your invoice.   Our billing staff will not be able to assist you with questions regarding bills from these companies.  You will be contacted with the lab results as soon as they are available. The fastest way to get your results is to activate your My Chart account. Instructions are located on the last page of this paperwork. If you have not heard from us regarding the results in 2 weeks, please contact this office.

## 2017-03-15 NOTE — Progress Notes (Signed)
Patient ID: Luis Ballard, male    DOB: 11/14/1990  Age: 26 y.o. MRN: [id number]  Chief Complaint  Patient presents with  . Follow-up    SORETHROAT    Subjective:   26 year old man who is here about 5 days ago with a respiratory tract infection and sore throat and sinus congestion. He was treated with Augmentin. He has improved but continues to have a sore throat which is worse in the morning. He is most of the way through the antibiotic course. No fever now.  Current allergies, medications, problem list, past/family and social histories reviewed.  Objective:  BP 102/62 (BP Location: Right Arm, Patient Position: Sitting, Cuff Size: Large)   Pulse 74   Temp 98.2 F (36.8 C) (Oral)   Resp 16   Ht 6\' 2"  (1.88 m)   Wt 219 lb (99.3 kg)   SpO2 96%   BMI 28.12 kg/m   Healthy-appearing gentleman in no major distress. TMs not well visualized due to wax. Throat mildly erythematous with a couple spots of white mucous plaque. He has some swelling of the uvula which has a little ecchymosis from clearing his throat and coughing. Without significant nodes. Chest clear. Heart regular without murmur.  Assessment & Plan:   Assessment: 1. Allergic pharyngitis   2. Post viral syndrome       Plan: See instructions  No orders of the defined types were placed in this encounter.   Meds ordered this encounter  Medications  . predniSONE (DELTASONE) 20 MG tablet    Sig: Take 3 daily for 2 days, then 2 for 2 days, then 1 for 2 days    Dispense:  12 tablet    Refill:  0         Patient Instructions    Drink plenty of fluids and get enough rest  Take the remaining Augmentin (amoxicillin/clavulanate)  Take the prednisone 3 pills daily for 2 days, then 2 daily for 2 days, then 1 daily for 2 days. Best taken in the morning after breakfast but take the first pills when you get them today. If you take them at bedtime sometimes messes up you sleep.  Also consider taking one of the  nonsedating antihistamines such as Claritin (loratadine) or Allegra ( fexofenadine) or Zyrtec (cetirizine)  I expect this to clear you up fairly quickly, but if you get worse at any time come back.   IF you received an x-ray today, you will receive an invoice from [facility]. Please contact [facility] at [phone] with questions or concerns regarding your invoice.   IF you received labwork today, you will receive an invoice from [facility]. Please contact [facility] at [phone] with questions or concerns regarding your invoice.   Our billing staff will not be able to assist you with questions regarding bills from these companies.  You will be contacted with the lab results as soon as they are available. The fastest way to get your results is to activate your My Chart account. Instructions are located on the last page of this paperwork. If you have not heard from us regarding the results in 2 weeks, please contact this office.        Return if symptoms worsen or fail to improve.   Tayron Hunnell, MD 03/15/2017

## 2017-03-19 ENCOUNTER — Encounter: Payer: Self-pay | Admitting: Emergency Medicine

## 2017-03-19 ENCOUNTER — Ambulatory Visit (INDEPENDENT_AMBULATORY_CARE_PROVIDER_SITE_OTHER): Payer: BLUE CROSS/BLUE SHIELD | Admitting: Emergency Medicine

## 2017-03-19 VITALS — BP 123/87 | HR 78 | Temp 98.0°F | Resp 18 | Ht 76.0 in | Wt 224.0 lb

## 2017-03-19 DIAGNOSIS — J029 Acute pharyngitis, unspecified: Secondary | ICD-10-CM | POA: Insufficient documentation

## 2017-03-19 MED ORDER — PREDNISONE 20 MG PO TABS: 40.0000 mg | ORAL_TABLET | Freq: Every day | ORAL | 0 refills | Status: AC

## 2017-03-19 MED ORDER — AZITHROMYCIN 250 MG PO TABS
ORAL_TABLET | ORAL | 0 refills | Status: AC
Start: 2017-03-19 — End: ?

## 2017-03-19 NOTE — Progress Notes (Signed)
Luis Ballard 26 y.o.   Chief Complaint  Patient presents with  . Sore Throat    x2 1/2 weeks, drainage, scratchy    HISTORY OF PRESENT ILLNESS: This is a 26 y.o. male complaining of 2-3 week h/o sore throat with ocassional expectoration; has cough and congestion; seen 5/14 and started on Augmentin; seen again on 5/19 and started on Prednisone; still symptomatic.  HPI   Prior to Admission medications   Medication Sig Start Date End Date Taking? Authorizing Provider  benzonatate (TESSALON) 100 MG capsule Take 1-2 capsules (100-200 mg total) by mouth 3 (three) times daily as needed. 03/10/17  Yes Wallis Bamberg, PA-C  predniSONE (DELTASONE) 20 MG tablet Take 3 daily for 2 days, then 2 for 2 days, then 1 for 2 days 03/15/17  Yes Peyton Najjar, MD  amoxicillin-clavulanate (AUGMENTIN) 500-125 MG tablet Take 1 tablet (500 mg total) by mouth 3 (three) times daily. Patient not taking: Reported on 03/19/2017 03/10/17   Wallis Bamberg, PA-C    Allergies  Allergen Reactions  . Sulfa Antibiotics Other (See Comments)    As a child...does not know reaction    There are no active problems to display for this patient.   Past Medical History:  Diagnosis Date  . Allergy     No past surgical history on file.  Social History   Social History  . Marital status: Single    Spouse name: N/A  . Number of children: N/A  . Years of education: N/A   Occupational History  . Not on file.   Social History Main Topics  . Smoking status: Never Smoker  . Smokeless tobacco: Never Used  . Alcohol use 0.0 oz/week  . Drug use: No  . Sexual activity: Not on file   Other Topics Concern  . Not on file   Social History Narrative  . No narrative on file    No family history on file.   Review of Systems  Constitutional: Negative.  Negative for chills, fever and weight loss.  HENT: Positive for congestion and sore throat. Negative for ear pain and nosebleeds.   Eyes: Negative.  Negative for  discharge and redness.  Respiratory: Positive for cough and sputum production. Negative for shortness of breath and wheezing.   Cardiovascular: Negative.  Negative for chest pain, palpitations and leg swelling.  Gastrointestinal: Negative.  Negative for abdominal pain, diarrhea, nausea and vomiting.  Genitourinary: Negative.  Negative for dysuria and hematuria.  Musculoskeletal: Negative for back pain, myalgias and neck pain.  Skin: Negative for rash.  Neurological: Negative for dizziness and headaches.  Endo/Heme/Allergies: Negative.   All other systems reviewed and are negative.  Vitals:   03/19/17 1410  BP: 123/87  Pulse: 78  Resp: 18  Temp: 98 F (36.7 C)     Physical Exam  Constitutional: He is oriented to person, place, and time. He appears well-developed and well-nourished.  HENT:  Head: Normocephalic and atraumatic.  Nose: Nose normal.  Mouth/Throat: Uvula is midline. Posterior oropharyngeal edema (mild) and posterior oropharyngeal erythema present. No oropharyngeal exudate.  Eyes: Conjunctivae and EOM are normal. Pupils are equal, round, and reactive to light.  Neck: Normal range of motion. Neck supple. No JVD present. No thyromegaly present.  Cardiovascular: Normal rate, regular rhythm and normal heart sounds.   Pulmonary/Chest: Effort normal and breath sounds normal.  Abdominal: Soft. He exhibits no distension. There is no tenderness.  Musculoskeletal: Normal range of motion.  Lymphadenopathy:    He has no cervical  adenopathy.  Neurological: He is alert and oriented to person, place, and time. No sensory deficit. He exhibits normal muscle tone.  Skin: Skin is warm and dry. Capillary refill takes less than 2 seconds. No rash noted.  Psychiatric: He has a normal mood and affect. His behavior is normal.  Vitals reviewed.    ASSESSMENT & PLAN: Luis Ballard was seen today for sore throat.  Diagnoses and all orders for this visit:  Sore throat -     Ambulatory referral  to ENT  Acute pharyngitis, unspecified etiology -     Ambulatory referral to ENT  Other orders -     azithromycin (ZITHROMAX) 250 MG tablet; Sig as indicated -     predniSONE (DELTASONE) 20 MG tablet; Take 2 tablets (40 mg total) by mouth daily with breakfast.    Patient Instructions       IF you received an x-ray today, you will receive an invoice from [facility]. Please contact [facility] at [phone] with questions or concerns regarding your invoice.   IF you received labwork today, you will receive an invoice from [facility]. Please contact [facility] at [phone] with questions or concerns regarding your invoice.   Our billing staff will not be able to assist you with questions regarding bills from these companies.  You will be contacted with the lab results as soon as they are available. The fastest way to get your results is to activate your My Chart account. Instructions are located on the last page of this paperwork. If you have not heard from us regarding the results in 2 weeks, please contact this office.     Sore Throat When you have a sore throat, your throat may:  Hurt.  Burn.  Feel irritated.  Feel scratchy. Many things can cause a sore throat, including:  An infection.  Allergies.  Dryness in the air.  Smoke or pollution.  Gastroesophageal reflux disease (GERD).  A tumor. A sore throat can be the first sign of another sickness. It can happen with other problems, like coughing or a fever. Most sore throats go away without treatment. Follow these instructions at home:  Take over-the-counter medicines only as told by your doctor.  Drink enough fluids to keep your pee (urine) clear or pale yellow.  Rest when you feel you need to.  To help with pain, try:  Sipping warm liquids, such as broth, herbal tea, or warm water.  Eating or drinking cold or frozen liquids, such as frozen ice pops.  Gargling with a salt-water  mixture 3-4 times a day or as needed. To make a salt-water mixture, add -1 tsp of salt in 1 cup of warm water. Mix it until you cannot see the salt anymore.  Sucking on hard candy or throat lozenges.  Putting a cool-mist humidifier in your bedroom at night.  Sitting in the bathroom with the door closed for 5-10 minutes while you run hot water in the shower.  Do not use any tobacco products, such as cigarettes, chewing tobacco, and e-cigarettes. If you need help quitting, ask your doctor. Contact a doctor if:  You have a fever for more than 2-3 days.  You keep having symptoms for more than 2-3 days.  Your throat does not get better in 7 days.  You have a fever and your symptoms suddenly get worse. Get help right away if:  You have trouble breathing.  You cannot swallow fluids, soft foods, or your saliva.  You have swelling in your throat  or neck that gets worse.  You keep feeling like you are going to throw up (vomit).  You keep throwing up. This information is not intended to replace advice given to you by your health care provider. Make sure you discuss any questions you have with your health care provider. Document Released: 07/23/2008 Document Revised: 06/09/2016 Document Reviewed: 08/04/2015 Elsevier Interactive Patient Education  2017 [employer]vier Inc.      Edwina Barth, MD [facility]

## 2017-03-19 NOTE — Patient Instructions (Addendum)
     IF you received an x-ray today, you will receive an invoice from [facility]. Please contact [facility] at [phone] with questions or concerns regarding your invoice.   IF you received labwork today, you will receive an invoice from [facility]. Please contact [facility] at [phone] with questions or concerns regarding your invoice.   Our billing staff will not be able to assist you with questions regarding bills from these companies.  You will be contacted with the lab results as soon as they are available. The fastest way to get your results is to activate your My Chart account. Instructions are located on the last page of this paperwork. If you have not heard from us regarding the results in 2 weeks, please contact this office.      Sore Throat When you have a sore throat, your throat may:  Hurt.  Burn.  Feel irritated.  Feel scratchy. Many things can cause a sore throat, including:  An infection.  Allergies.  Dryness in the air.  Smoke or pollution.  Gastroesophageal reflux disease (GERD).  A tumor. A sore throat can be the first sign of another sickness. It can happen with other problems, like coughing or a fever. Most sore throats go away without treatment. Follow these instructions at home:  Take over-the-counter medicines only as told by your doctor.  Drink enough fluids to keep your pee (urine) clear or pale yellow.  Rest when you feel you need to.  To help with pain, try:  Sipping warm liquids, such as broth, herbal tea, or warm water.  Eating or drinking cold or frozen liquids, such as frozen ice pops.  Gargling with a salt-water mixture 3-4 times a day or as needed. To make a salt-water mixture, add -1 tsp of salt in 1 cup of warm water. Mix it until you cannot see the salt anymore.  Sucking on hard candy or throat lozenges.  Putting a cool-mist humidifier in your bedroom at night.  Sitting in the bathroom with the door  closed for 5-10 minutes while you run hot water in the shower.  Do not use any tobacco products, such as cigarettes, chewing tobacco, and e-cigarettes. If you need help quitting, ask your doctor. Contact a doctor if:  You have a fever for more than 2-3 days.  You keep having symptoms for more than 2-3 days.  Your throat does not get better in 7 days.  You have a fever and your symptoms suddenly get worse. Get help right away if:  You have trouble breathing.  You cannot swallow fluids, soft foods, or your saliva.  You have swelling in your throat or neck that gets worse.  You keep feeling like you are going to throw up (vomit).  You keep throwing up. This information is not intended to replace advice given to you by your health care provider. Make sure you discuss any questions you have with your health care provider. Document Released: 07/23/2008 Document Revised: 06/09/2016 Document Reviewed: 08/04/2015 [employer] Interactive Patient Education  2017 [employer]
